# Patient Record
Sex: Female | Born: 2005 | Race: White | Hispanic: No | Marital: Single | Smoking: Never smoker
Health system: Southern US, Community
[De-identification: ages and names within clinical notes are randomized; demographics above are authoritative.]

## PROBLEM LIST (undated history)

## (undated) DIAGNOSIS — R51 Headache: Secondary | ICD-10-CM

## (undated) DIAGNOSIS — J029 Acute pharyngitis, unspecified: Secondary | ICD-10-CM

## (undated) DIAGNOSIS — K219 Gastro-esophageal reflux disease without esophagitis: Secondary | ICD-10-CM

## (undated) DIAGNOSIS — N926 Irregular menstruation, unspecified: Secondary | ICD-10-CM

## (undated) DIAGNOSIS — E282 Polycystic ovarian syndrome: Secondary | ICD-10-CM

## (undated) DIAGNOSIS — Q892 Congenital malformations of other endocrine glands: Secondary | ICD-10-CM

## (undated) DIAGNOSIS — R110 Nausea: Secondary | ICD-10-CM

---
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
## 2023-05-26 ENCOUNTER — Telehealth: Payer: Self-pay | Admitting: Family Medicine

## 2023-06-20 ENCOUNTER — Telehealth: Payer: Self-pay | Admitting: Family Medicine
